# Patient Record
Sex: Female | Born: 1985 | Race: White | Hispanic: No | Marital: Single | Smoking: Current some day smoker
Health system: Southern US, Community
[De-identification: ages and names within clinical notes are randomized; demographics above are authoritative.]

## PROBLEM LIST (undated history)

## (undated) DIAGNOSIS — K219 Gastro-esophageal reflux disease without esophagitis: Secondary | ICD-10-CM

## (undated) DIAGNOSIS — R87629 Unspecified abnormal cytological findings in specimens from vagina: Secondary | ICD-10-CM

## (undated) DIAGNOSIS — N83209 Unspecified ovarian cyst, unspecified side: Secondary | ICD-10-CM

## (undated) HISTORY — PX: ADENOIDECTOMY: SUR15

## (undated) HISTORY — PX: KNEE SURGERY: SHX244

## (undated) HISTORY — PX: WISDOM TOOTH EXTRACTION: SHX21

## (undated) HISTORY — PX: TONSILLECTOMY: SUR1361

## (undated) HISTORY — PX: COLPOSCOPY W/ BIOPSY / CURETTAGE: SUR283

---
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
## 2005-05-01 ENCOUNTER — Other Ambulatory Visit: Admission: RE | Admit: 2005-05-01 | Discharge: 2005-05-01 | Payer: Self-pay | Admitting: Obstetrics and Gynecology

## 2005-07-23 ENCOUNTER — Encounter: Admission: RE | Admit: 2005-07-23 | Discharge: 2005-07-23 | Payer: Self-pay | Admitting: Allergy and Immunology

## 2006-06-20 ENCOUNTER — Other Ambulatory Visit: Admission: RE | Admit: 2006-06-20 | Discharge: 2006-06-20 | Payer: Self-pay | Admitting: Obstetrics and Gynecology

## 2007-09-05 ENCOUNTER — Emergency Department (HOSPITAL_COMMUNITY): Admission: EM | Admit: 2007-09-05 | Discharge: 2007-09-05 | Payer: Self-pay | Admitting: Family Medicine

## 2011-07-09 ENCOUNTER — Inpatient Hospital Stay (INDEPENDENT_AMBULATORY_CARE_PROVIDER_SITE_OTHER)
Admission: RE | Admit: 2011-07-09 | Discharge: 2011-07-09 | Disposition: A | Discharge status: Home / Self Care | Payer: 59 | Source: Ambulatory Visit | Attending: Emergency Medicine | Admitting: Emergency Medicine

## 2011-07-09 DIAGNOSIS — H5704 Mydriasis: Secondary | ICD-10-CM

## 2013-03-24 ENCOUNTER — Other Ambulatory Visit: Payer: Self-pay | Admitting: Obstetrics and Gynecology

## 2013-03-25 LAB — PAP IG, CT-NG, RFX HPV ASCU
Chlamydia Probe Amp: NEGATIVE
GC Probe Amp: NEGATIVE

## 2014-05-11 ENCOUNTER — Emergency Department (HOSPITAL_BASED_OUTPATIENT_CLINIC_OR_DEPARTMENT_OTHER)
Admission: EM | Admit: 2014-05-11 | Discharge: 2014-05-11 | Disposition: A | Discharge status: Home / Self Care | Payer: 59 | Attending: Emergency Medicine | Admitting: Emergency Medicine

## 2014-05-11 ENCOUNTER — Encounter (HOSPITAL_BASED_OUTPATIENT_CLINIC_OR_DEPARTMENT_OTHER): Payer: Self-pay | Admitting: Emergency Medicine

## 2014-05-11 ENCOUNTER — Emergency Department (HOSPITAL_BASED_OUTPATIENT_CLINIC_OR_DEPARTMENT_OTHER): Payer: 59

## 2014-05-11 DIAGNOSIS — R638 Other symptoms and signs concerning food and fluid intake: Secondary | ICD-10-CM | POA: Insufficient documentation

## 2014-05-11 DIAGNOSIS — R110 Nausea: Secondary | ICD-10-CM | POA: Insufficient documentation

## 2014-05-11 DIAGNOSIS — Z8719 Personal history of other diseases of the digestive system: Secondary | ICD-10-CM | POA: Insufficient documentation

## 2014-05-11 DIAGNOSIS — F17200 Nicotine dependence, unspecified, uncomplicated: Secondary | ICD-10-CM | POA: Insufficient documentation

## 2014-05-11 DIAGNOSIS — R109 Unspecified abdominal pain: Secondary | ICD-10-CM

## 2014-05-11 DIAGNOSIS — Z3202 Encounter for pregnancy test, result negative: Secondary | ICD-10-CM | POA: Insufficient documentation

## 2014-05-11 DIAGNOSIS — R1011 Right upper quadrant pain: Secondary | ICD-10-CM | POA: Insufficient documentation

## 2014-05-11 LAB — URINALYSIS, ROUTINE W REFLEX MICROSCOPIC
Bilirubin Urine: NEGATIVE
Glucose, UA: NEGATIVE mg/dL
Hgb urine dipstick: NEGATIVE
Ketones, ur: NEGATIVE mg/dL
Leukocytes, UA: NEGATIVE
Nitrite: NEGATIVE
Protein, ur: NEGATIVE mg/dL
Specific Gravity, Urine: 1.006 (ref 1.005–1.030)
Urobilinogen, UA: 0.2 mg/dL (ref 0.0–1.0)
pH: 6.5 (ref 5.0–8.0)

## 2014-05-11 LAB — CBC WITH DIFFERENTIAL/PLATELET
Basophils Absolute: 0 10*3/uL (ref 0.0–0.1)
Basophils Relative: 0 % (ref 0–1)
EOS PCT: 4 % (ref 0–5)
Eosinophils Absolute: 0.3 10*3/uL (ref 0.0–0.7)
HEMATOCRIT: 44.5 % (ref 36.0–46.0)
Hemoglobin: 16.2 g/dL — ABNORMAL HIGH (ref 12.0–15.0)
LYMPHS PCT: 39 % (ref 12–46)
Lymphs Abs: 2.7 10*3/uL (ref 0.7–4.0)
MCH: 32.5 pg (ref 26.0–34.0)
MCHC: 36.4 g/dL — ABNORMAL HIGH (ref 30.0–36.0)
MCV: 89.2 fL (ref 78.0–100.0)
MONO ABS: 0.7 10*3/uL (ref 0.1–1.0)
Monocytes Relative: 10 % (ref 3–12)
Neutro Abs: 3.2 10*3/uL (ref 1.7–7.7)
Neutrophils Relative %: 47 % (ref 43–77)
Platelets: 224 10*3/uL (ref 150–400)
RBC: 4.99 MIL/uL (ref 3.87–5.11)
RDW: 13.7 % (ref 11.5–15.5)
WBC: 6.9 10*3/uL (ref 4.0–10.5)

## 2014-05-11 LAB — COMPREHENSIVE METABOLIC PANEL
ALT: 25 U/L (ref 0–35)
AST: 23 U/L (ref 0–37)
Albumin: 4.3 g/dL (ref 3.5–5.2)
Alkaline Phosphatase: 64 U/L (ref 39–117)
BUN: 13 mg/dL (ref 6–23)
CALCIUM: 9.5 mg/dL (ref 8.4–10.5)
CO2: 20 meq/L (ref 19–32)
CREATININE: 0.7 mg/dL (ref 0.50–1.10)
Chloride: 103 mEq/L (ref 96–112)
GFR calc Af Amer: >90 mL/min (ref >90)
Glucose, Bld: 96 mg/dL (ref 70–99)
Potassium: 4.3 mEq/L (ref 3.7–5.3)
SODIUM: 138 meq/L (ref 137–147)
TOTAL PROTEIN: 7.5 g/dL (ref 6.0–8.3)
Total Bilirubin: 0.7 mg/dL (ref 0.3–1.2)

## 2014-05-11 LAB — LIPASE, BLOOD: Lipase: 17 U/L (ref 11–59)

## 2014-05-11 LAB — PREGNANCY, URINE: Preg Test, Ur: NEGATIVE

## 2014-05-11 MED ORDER — OMEPRAZOLE 20 MG PO CPDR: 20.0000 mg | DELAYED_RELEASE_CAPSULE | Freq: Every day | ORAL | Status: AC

## 2014-05-11 NOTE — Discharge Instructions (Signed)
Abdominal Pain, Women °Abdominal (stomach, pelvic, or belly) pain can be caused by many things. It is important to tell your doctor: °· The location of the pain. °· Does it come and go or is it present all the time? °· Are there things that start the pain (eating certain foods, exercise)? °· Are there other symptoms associated with the pain (fever, nausea, vomiting, diarrhea)? °All of this is helpful to know when trying to find the cause of the pain. °CAUSES  °· Stomach: virus or bacteria infection, or ulcer. °· Intestine: appendicitis (inflamed appendix), regional ileitis (Crohn's disease), ulcerative colitis (inflamed colon), irritable bowel syndrome, diverticulitis (inflamed diverticulum of the colon), or cancer of the stomach or intestine. °· Gallbladder disease or stones in the gallbladder. °· Kidney disease, kidney stones, or infection. °· Pancreas infection or cancer. °· Fibromyalgia (pain disorder). °· Diseases of the female organs: °· Uterus: fibroid (non-cancerous) tumors or infection. °· Fallopian tubes: infection or tubal pregnancy. °· Ovary: cysts or tumors. °· Pelvic adhesions (scar tissue). °· Endometriosis (uterus lining tissue growing in the pelvis and on the pelvic organs). °· Pelvic congestion syndrome (female organs filling up with blood just before the menstrual period). °· Pain with the menstrual period. °· Pain with ovulation (producing an egg). °· Pain with an IUD (intrauterine device, birth control) in the uterus. °· Cancer of the female organs. °· Functional pain (pain not caused by a disease, may improve without treatment). °· Psychological pain. °· Depression. °DIAGNOSIS  °Your doctor will decide the seriousness of your pain by doing an examination. °· Blood tests. °· X-rays. °· Ultrasound. °· CT scan (computed tomography, special type of X-ray). °· MRI (magnetic resonance imaging). °· Cultures, for infection. °· Barium enema (dye inserted in the large intestine, to better view it with  X-rays). °· Colonoscopy (looking in intestine with a lighted tube). °· Laparoscopy (minor surgery, looking in abdomen with a lighted tube). °· Major abdominal exploratory surgery (looking in abdomen with a large incision). °TREATMENT  °The treatment will depend on the cause of the pain.  °· Many cases can be observed and treated at home. °· Over-the-counter medicines recommended by your caregiver. °· Prescription medicine. °· Antibiotics, for infection. °· Birth control pills, for painful periods or for ovulation pain. °· Hormone treatment, for endometriosis. °· Nerve blocking injections. °· Physical therapy. °· Antidepressants. °· Counseling with a psychologist or psychiatrist. °· Minor or major surgery. °HOME CARE INSTRUCTIONS  °· Do not take laxatives, unless directed by your caregiver. °· Take over-the-counter pain medicine only if ordered by your caregiver. Do not take aspirin because it can cause an upset stomach or bleeding. °· Try a clear liquid diet (broth or water) as ordered by your caregiver. Slowly move to a bland diet, as tolerated, if the pain is related to the stomach or intestine. °· Have a thermometer and take your temperature several times a day, and record it. °· Bed rest and sleep, if it helps the pain. °· Avoid sexual intercourse, if it causes pain. °· Avoid stressful situations. °· Keep your follow-up appointments and tests, as your caregiver orders. °· If the pain does not go away with medicine or surgery, you may try: °· Acupuncture. °· Relaxation exercises (yoga, meditation). °· Group therapy. °· Counseling. °SEEK MEDICAL CARE IF:  °· You notice certain foods cause stomach pain. °· Your home care treatment is not helping your pain. °· You need stronger pain medicine. °· You want your IUD removed. °· You feel faint or   lightheaded. °· You develop nausea and vomiting. °· You develop a rash. °· You are having side effects or an allergy to your medicine. °SEEK IMMEDIATE MEDICAL CARE IF:  °· Your  pain does not go away or gets worse. °· You have a fever. °· Your pain is felt only in portions of the abdomen. The right side could possibly be appendicitis. The left lower portion of the abdomen could be colitis or diverticulitis. °· You are passing blood in your stools (bright red or black tarry stools, with or without vomiting). °· You have blood in your urine. °· You develop chills, with or without a fever. °· You pass out. °MAKE SURE YOU:  °· Understand these instructions. °· Will watch your condition. °· Will get help right away if you are not doing well or get worse. °Document Released: 10/13/2007 Document Revised: 03/09/2012 Document Reviewed: 11/02/2009 °Exit[employer]® Patient Information ©2014 [employer]. ° °

## 2014-05-11 NOTE — ED Provider Notes (Signed)
CSN: [id number]     Arrival date & time 05/11/14  1147 History   First MD Initiated Contact with Patient 05/11/14 1205     Chief Complaint  Patient presents with  . Abdominal Pain     (Consider location/radiation/quality/duration/timing/severity/associated sxs/prior Treatment) HPI Comments: Patient with history of Acid Reflux presents today with 8 days of abdominal pain.  The pain is located in the RUQ and is a constant dull ache throughout the day.  It feels like a sharp stabing/ shooting pain if she eats any food.  The pain also radiates to her back occasionally.  Nothing has helped relieve the pain.  Patient denies any burning sensation like her typical reflux symptoms.  She has had persistent nausea but denies vomiting. Denies fevers. She denies constipation and is still able to pass gas.  Denies dysuria, vaginal discharge.  She also states that every woman in her family has needed a cholecystectomy after their first child.    Patient is a 28 y.o. female presenting with abdominal pain. The history is provided by the patient. No language interpreter was used.  Abdominal Pain Associated symptoms: nausea   Associated symptoms: no chest pain, no constipation, no diarrhea, no dysuria, no fever, no hematuria, no shortness of breath, no vaginal discharge and no vomiting       History reviewed. No pertinent past medical history. Past Surgical History  Procedure Laterality Date  . Cesarean section    . Knee surgery    . Tonsillectomy    . Adenoidectomy     No family history on file. History  Substance Use Topics  . Smoking status: Current Some Day Smoker  . Smokeless tobacco: Not on file  . Alcohol Use: Yes   OB History   Grav Para Term Preterm Abortions TAB SAB Ect Mult Living                 Review of Systems  Constitutional: Positive for appetite change. Negative for fever.  Respiratory: Negative for chest tightness and shortness of breath.   Cardiovascular: Negative for  chest pain.  Gastrointestinal: Positive for nausea and abdominal pain. Negative for vomiting, diarrhea, constipation and blood in stool.  Genitourinary: Negative for dysuria, frequency, hematuria, flank pain, vaginal discharge and difficulty urinating.  Neurological: Negative for dizziness, numbness and headaches.      Allergies  Review of patient's allergies indicates no known allergies.  Home Medications   Prior to Admission medications   Not on File   BP 126/78  Pulse 88  Temp(Src) 98.2 F (36.8 C) (Oral)  Resp 16  Ht 5\' 4"  (1.626 m)  Wt 162 lb (73.483 kg)  BMI 27.79 kg/m2  SpO2 100% Physical Exam  Constitutional: She is oriented to person, place, and time. She appears well-developed and well-nourished. No distress.  Eyes: Conjunctivae are normal. Pupils are equal, round, and reactive to light.  Neck: Normal range of motion.  Cardiovascular: Normal rate, regular rhythm and normal heart sounds.   Pulmonary/Chest: Effort normal and breath sounds normal. No respiratory distress. She has no wheezes.  Abdominal: Soft. Bowel sounds are normal. She exhibits no mass. There is tenderness.  RUQ tenderness that extends to epigastrium.   Neurological: She is alert and oriented to person, place, and time.  Skin: She is not diaphoretic.  Psychiatric: She has a normal mood and affect.    ED Course  Procedures (including critical care time) Labs Review Labs Reviewed  URINALYSIS, ROUTINE W REFLEX MICROSCOPIC  PREGNANCY, URINE  CBC WITH DIFFERENTIAL  COMPREHENSIVE METABOLIC PANEL  LIPASE, BLOOD    Imaging Review No results found.   EKG Interpretation None      MDM   Final diagnoses:  None    1. Abdominal pain  She is comfortable appearing. Labs, ultrasound negative. She reports frustration that answers have not been found. Refer to GI for further evaluation.    Arnoldo HookerShari A Marquelle Balow, PA-C 05/15/14 1253

## 2014-05-11 NOTE — ED Notes (Signed)
Pt returned from US

## 2014-05-11 NOTE — ED Notes (Signed)
abd pain x 8 days-worse x 5 days-pain worse with nausea after eating-feels bloated-last normal BM yesterday-denies V/D

## 2014-05-15 ENCOUNTER — Inpatient Hospital Stay (HOSPITAL_COMMUNITY)
Admission: AD | Admit: 2014-05-15 | Discharge: 2014-05-15 | Disposition: A | Discharge status: Home / Self Care | Payer: 59 | Source: Ambulatory Visit | Attending: Obstetrics and Gynecology | Admitting: Obstetrics and Gynecology

## 2014-05-15 ENCOUNTER — Inpatient Hospital Stay (HOSPITAL_COMMUNITY): Payer: 59

## 2014-05-15 ENCOUNTER — Encounter (HOSPITAL_COMMUNITY): Payer: Self-pay | Admitting: *Deleted

## 2014-05-15 DIAGNOSIS — K219 Gastro-esophageal reflux disease without esophagitis: Secondary | ICD-10-CM | POA: Insufficient documentation

## 2014-05-15 DIAGNOSIS — N949 Unspecified condition associated with female genital organs and menstrual cycle: Secondary | ICD-10-CM | POA: Insufficient documentation

## 2014-05-15 DIAGNOSIS — Z30431 Encounter for routine checking of intrauterine contraceptive device: Secondary | ICD-10-CM | POA: Insufficient documentation

## 2014-05-15 DIAGNOSIS — F17200 Nicotine dependence, unspecified, uncomplicated: Secondary | ICD-10-CM | POA: Insufficient documentation

## 2014-05-15 DIAGNOSIS — N760 Acute vaginitis: Secondary | ICD-10-CM | POA: Insufficient documentation

## 2014-05-15 DIAGNOSIS — B9689 Other specified bacterial agents as the cause of diseases classified elsewhere: Secondary | ICD-10-CM | POA: Insufficient documentation

## 2014-05-15 DIAGNOSIS — A499 Bacterial infection, unspecified: Secondary | ICD-10-CM | POA: Insufficient documentation

## 2014-05-15 HISTORY — DX: Unspecified ovarian cyst, unspecified side: N83.209

## 2014-05-15 HISTORY — DX: Gastro-esophageal reflux disease without esophagitis: K21.9

## 2014-05-15 HISTORY — DX: Unspecified abnormal cytological findings in specimens from vagina: R87.629

## 2014-05-15 LAB — WET PREP, GENITAL
Trich, Wet Prep: NONE SEEN
Yeast Wet Prep HPF POC: NONE SEEN

## 2014-05-15 LAB — URINE MICROSCOPIC-ADD ON: WBC, UA: NONE SEEN WBC/hpf (ref <3)

## 2014-05-15 LAB — URINALYSIS, ROUTINE W REFLEX MICROSCOPIC
Bilirubin Urine: NEGATIVE
GLUCOSE, UA: NEGATIVE mg/dL
Ketones, ur: NEGATIVE mg/dL
LEUKOCYTES UA: NEGATIVE
Nitrite: NEGATIVE
PROTEIN: NEGATIVE mg/dL
UROBILINOGEN UA: 0.2 mg/dL (ref 0.0–1.0)
pH: 6 (ref 5.0–8.0)

## 2014-05-15 LAB — POCT PREGNANCY, URINE: PREG TEST UR: NEGATIVE

## 2014-05-15 MED ORDER — METRONIDAZOLE 500 MG PO TABS: 500.0000 mg | ORAL_TABLET | Freq: Two times a day (BID) | ORAL | Status: AC

## 2014-05-15 NOTE — [facility]U Provider Note (Signed)
History   28yo, G1P1 non-pregnant female presents to MAU today for lower pelvic pain, bilateral - dull with "flares" described as cramping.  Pelvic pain for 2 weeks, seen at ED 5 days ago and worked up for gallbladder/appendix - neg, referred to GI consult (pt does not plan to follow up with this).  Pt also note her breast have increased in size and are "sore, nipple sensitivity" for the last couple of days.  Pt states she feels that her symptoms are identical to symptoms she had previously in early pregnancy.    Labs 5/13 UPT neg UA neg Lipase 17 CMP neg CBC with diff - Hgb 16.2, HCT 44.5  US 5/13 Normal abdominal ultrasound examination. Specifically there are no findings to suggest acute gallbladder pathology.   There are no active problems to display for this patient.   No chief complaint on file.  HPI  OB History   Grav Para Term Preterm Abortions TAB SAB Ect Mult Living   1 1 1       1       Past Medical History  Diagnosis Date  . GERD (gastroesophageal reflux disease)   . Ovarian cyst   . Vaginal Pap smear, abnormal     Past Surgical History  Procedure Laterality Date  . Cesarean section    . Knee surgery    . Tonsillectomy    . Adenoidectomy    . Wisdom tooth extraction    . Colposcopy w/ biopsy / curettage      History reviewed. No pertinent family history.  History  Substance Use Topics  . Smoking status: Current Some Day Smoker -- 13.00 packs/day for 6 years    Types: Cigarettes  . Smokeless tobacco: Not on file  . Alcohol Use: Yes     Comment: once weekly    Allergies: No Known Allergies  Prescriptions prior to admission  Medication Sig Dispense Refill  . Multiple Vitamin (MULTIVITAMIN WITH MINERALS) TABS tablet Take 1 tablet by mouth daily.      Marland Kitchen. omeprazole (PRILOSEC) 20 MG capsule Take 1 capsule (20 mg total) by mouth daily.  20 capsule  0  . SIMETHICONE PO Take 1 tablet by mouth daily as needed (gas).        ROS ROS: see HPI above, all  other systems are negative  Physical Exam   Blood pressure 129/84, pulse 80, temperature 98.2 F (36.8 C), temperature source Oral, resp. rate 16, height 5\' 4"  (1.626 m), weight 173 lb (78.472 kg), last menstrual period 04/10/2014.  Physical Exam Chest: Clear Heart: RRR Abdomen: gravid, NT Extremities: WNL  Pelvic exam:  VULVA: normal appearing vulva with no masses, tenderness or lesions VAGINA: normal appearing vagina with normal color and discharge, no lesions CERVIX: normal appearing cervix without discharge or lesions, cervical motion tenderness absent, Mirena strings present UTERUS: uterus is normal size, shape, consistency and nontender, tenderness noted suprapubically ADNEXA: tenderness center and right, lower, exam chaperoned by Belenda CruiseKristin.  Ultrasound: Uterus - Measurements: 9.5 x 4.6 x 4.7 cm. No fibroids or other mass visualized.  Endometrium - Intrauterine device well seated central in the endometrial cavity.  Right ovary - Measurements: 3.2 x 1 x 1.3 cm. Normal appearance/no adnexal mass. Normal color flow.  Left ovary - Measurements: 3.1 x 1.7 x 1.3 cm. Normal appearance/no adnexal mass. Normal color flow.  Other findings: No free fluid   IMPRESSION:  Well seated intrauterine device, with otherwise unremarkable pelvic ultrasound.   ED Course  28yo G1P1 female  with lower pelvic pain Mirena in place  Wet Prep - few clue cells GC/CT - pending Pelvic us - unremarkable  D/c home with precautions Metronidazole for BV F/u this week with Dr. Estanislado Pandyivard - message sent to office to schedule     Sherry LawsJennifer Elandra Alvarez CNM, MSN 05/15/2014 9:31 PM

## 2014-05-15 NOTE — Discharge Instructions (Signed)
Bacterial Vaginosis °Bacterial vaginosis is a vaginal infection that occurs when the normal balance of bacteria in the vagina is disrupted. It results from an overgrowth of certain bacteria. This is the most common vaginal infection in women of childbearing age. Treatment is important to prevent complications, especially in pregnant women, as it can cause a premature delivery. °CAUSES  °Bacterial vaginosis is caused by an increase in harmful bacteria that are normally present in smaller amounts in the vagina. Several different kinds of bacteria can cause bacterial vaginosis. However, the reason that the condition develops is not fully understood. °RISK FACTORS °Certain activities or behaviors can put you at an increased risk of developing bacterial vaginosis, including: °· Having a new sex partner or multiple sex partners. °· Douching. °· Using an intrauterine device (IUD) for contraception. °Women do not get bacterial vaginosis from toilet seats, bedding, swimming pools, or contact with objects around them. °SIGNS AND SYMPTOMS  °Some women with bacterial vaginosis have no signs or symptoms. Common symptoms include: °· Grey vaginal discharge. °· A fishlike odor with discharge, especially after sexual intercourse. °· Itching or burning of the vagina and vulva. °· Burning or pain with urination. °DIAGNOSIS  °Your health care provider will take a medical history and examine the vagina for signs of bacterial vaginosis. A sample of vaginal fluid may be taken. Your health care provider will look at this sample under a microscope to check for bacteria and abnormal cells. A vaginal pH test may also be done.  °TREATMENT  °Bacterial vaginosis may be treated with antibiotic medicines. These may be given in the form of a pill or a vaginal cream. A second round of antibiotics may be prescribed if the condition comes back after treatment.  °HOME CARE INSTRUCTIONS  °· Only take over-the-counter or prescription medicines as  directed by your health care provider. °· If antibiotic medicine was prescribed, take it as directed. Make sure you finish it even if you start to feel better. °· Do not have sex until treatment is completed. °· Tell all sexual partners that you have a vaginal infection. They should see their health care provider and be treated if they have problems, such as a mild rash or itching. °· Practice safe sex by using condoms and only having one sex partner. °SEEK MEDICAL CARE IF:  °· Your symptoms are not improving after 3 days of treatment. °· You have increased discharge or pain. °· You have a fever. °MAKE SURE YOU:  °· Understand these instructions. °· Will watch your condition. °· Will get help right away if you are not doing well or get worse. °FOR MORE INFORMATION  °Centers for Disease Control and Prevention, Division of STD Prevention: www.cdc.gov/std °American Sexual Health Association (ASHA): www.ashastd.org  °Document Released: 12/16/2005 Document Revised: 10/06/2013 Document Reviewed: 07/28/2013 °[employer]® Patient Information ©2014 [employer]. °Metronidazole tablets or capsules °What is this medicine? °METRONIDAZOLE (me troe NI da zole) is an antiinfective. It is used to treat certain kinds of bacterial and protozoal infections. It will not work for colds, flu, or other viral infections. °This medicine may be used for other purposes; ask your health care provider or pharmacist if you have questions. °COMMON BRAND NAME(S): Flagyl °What should I tell my health care provider before I take this medicine? °They need to know if you have any of these conditions: °-anemia or other blood disorders °-disease of the nervous system °-fungal or yeast infection °-if you drink alcohol containing drinks °-liver disease °-seizures °-an unusual or allergic   reaction to metronidazole, or other medicines, foods, dyes, or preservatives °-pregnant or trying to get pregnant °-breast-feeding °How should I use this medicine? °Take  this medicine by mouth with a full glass of water. Follow the directions on the prescription label. Take your medicine at regular intervals. Do not take your medicine more often than directed. Take all of your medicine as directed even if you think you are better. Do not skip doses or stop your medicine early. °Talk to your pediatrician regarding the use of this medicine in children. Special care may be needed. °Overdosage: If you think you have taken too much of this medicine contact a poison control center or emergency room at once. °NOTE: This medicine is only for you. Do not share this medicine with others. °What if I miss a dose? °If you miss a dose, take it as soon as you can. If it is almost time for your next dose, take only that dose. Do not take double or extra doses. °What may interact with this medicine? °Do not take this medicine with any of the following medications: °-alcohol or any product that contains alcohol °-amprenavir oral solution °-cisapride °-disulfiram °-dofetilide °-dronedarone °-paclitaxel injection °-pimozide °-ritonavir oral solution °-sertraline oral solution °-sulfamethoxazole-trimethoprim injection °-thioridazine °-ziprasidone °This medicine may also interact with the following medications: °-cimetidine °-lithium °-other medicines that prolong the QT interval (cause an abnormal heart rhythm) °-phenobarbital °-phenytoin °-warfarin °This list may not describe all possible interactions. Give your health care provider a list of all the medicines, herbs, non-prescription drugs, or dietary supplements you use. Also tell them if you smoke, drink alcohol, or use illegal drugs. Some items may interact with your medicine. °What should I watch for while using this medicine? °Tell your doctor or health care professional if your symptoms do not improve or if they get worse. °You may get drowsy or dizzy. Do not drive, use machinery, or do anything that needs mental alertness until you know how  this medicine affects you. Do not stand or sit up quickly, especially if you are an older patient. This reduces the risk of dizzy or fainting spells. °Avoid alcoholic drinks while you are taking this medicine and for three days afterward. Alcohol may make you feel dizzy, sick, or flushed. °If you are being treated for a sexually transmitted disease, avoid sexual contact until you have finished your treatment. Your sexual partner may also need treatment. °What side effects may I notice from receiving this medicine? °Side effects that you should report to your doctor or health care professional as soon as possible: °-allergic reactions like skin rash or hives, swelling of the face, lips, or tongue °-confusion, clumsiness °-difficulty speaking °-discolored or sore mouth °-dizziness °-fever, infection °-numbness, tingling, pain or weakness in the hands or feet °-trouble passing urine or change in the amount of urine °-redness, blistering, peeling or loosening of the skin, including inside the mouth °-seizures °-unusually weak or tired °-vaginal irritation, dryness, or discharge °Side effects that usually do not require medical attention (report to your doctor or health care professional if they continue or are bothersome): °-diarrhea °-headache °-irritability °-metallic taste °-nausea °-stomach pain or cramps °-trouble sleeping °This list may not describe all possible side effects. Call your doctor for medical advice about side effects. You may report side effects to FDA at 1-800-FDA-1088. °Where should I keep my medicine? °Keep out of the reach of children. °Store at room temperature below 25 degrees C (77 degrees F). Protect from light. Keep container tightly closed.   Throw away any unused medicine after the expiration date. °NOTE: This sheet is a summary. It may not cover all possible information. If you have questions about this medicine, talk to your doctor, pharmacist, or health care provider. °© 2014,  [employer]. (2013-06-15 14:08:26) ° °

## 2014-05-16 LAB — GC/CHLAMYDIA PROBE AMP
CT Probe RNA: POSITIVE — AB
GC PROBE AMP APTIMA: NEGATIVE

## 2014-05-16 NOTE — ED Provider Notes (Signed)
Medical screening examination/treatment/procedure(s) were performed by non-physician practitioner and as supervising physician I was immediately available for consultation/collaboration.     Geoffery Lyonsouglas Jerilynn Feldmeier, MD 05/16/14 [phone]

## 2014-06-29 ENCOUNTER — Emergency Department (HOSPITAL_BASED_OUTPATIENT_CLINIC_OR_DEPARTMENT_OTHER): Payer: 59

## 2014-06-29 ENCOUNTER — Encounter (HOSPITAL_BASED_OUTPATIENT_CLINIC_OR_DEPARTMENT_OTHER): Payer: Self-pay | Admitting: Emergency Medicine

## 2014-06-29 ENCOUNTER — Emergency Department (HOSPITAL_BASED_OUTPATIENT_CLINIC_OR_DEPARTMENT_OTHER)
Admission: EM | Admit: 2014-06-29 | Discharge: 2014-06-29 | Disposition: A | Discharge status: Home / Self Care | Payer: 59 | Attending: Emergency Medicine | Admitting: Emergency Medicine

## 2014-06-29 DIAGNOSIS — S8990XA Unspecified injury of unspecified lower leg, initial encounter: Secondary | ICD-10-CM | POA: Insufficient documentation

## 2014-06-29 DIAGNOSIS — F17200 Nicotine dependence, unspecified, uncomplicated: Secondary | ICD-10-CM | POA: Insufficient documentation

## 2014-06-29 DIAGNOSIS — Z9889 Other specified postprocedural states: Secondary | ICD-10-CM | POA: Insufficient documentation

## 2014-06-29 DIAGNOSIS — K219 Gastro-esophageal reflux disease without esophagitis: Secondary | ICD-10-CM | POA: Insufficient documentation

## 2014-06-29 DIAGNOSIS — Y9389 Activity, other specified: Secondary | ICD-10-CM | POA: Insufficient documentation

## 2014-06-29 DIAGNOSIS — Z8742 Personal history of other diseases of the female genital tract: Secondary | ICD-10-CM | POA: Insufficient documentation

## 2014-06-29 DIAGNOSIS — S99919A Unspecified injury of unspecified ankle, initial encounter: Principal | ICD-10-CM

## 2014-06-29 DIAGNOSIS — S99929A Unspecified injury of unspecified foot, initial encounter: Principal | ICD-10-CM

## 2014-06-29 DIAGNOSIS — R296 Repeated falls: Secondary | ICD-10-CM | POA: Insufficient documentation

## 2014-06-29 DIAGNOSIS — M79672 Pain in left foot: Secondary | ICD-10-CM

## 2014-06-29 DIAGNOSIS — Y9289 Other specified places as the place of occurrence of the external cause: Secondary | ICD-10-CM | POA: Insufficient documentation

## 2014-06-29 DIAGNOSIS — Z79899 Other long term (current) drug therapy: Secondary | ICD-10-CM | POA: Insufficient documentation

## 2014-06-29 NOTE — ED Notes (Signed)
Left foot pain s/p injury in June, pt able to ambulate

## 2014-06-29 NOTE — ED Provider Notes (Signed)
CSN: [id number]     Arrival date & time 06/29/14  2001 History   First MD Initiated Contact with Patient 06/29/14 2044     Chief Complaint  Patient presents with  . Foot Injury     (Consider location/radiation/quality/duration/timing/severity/associated sxs/prior Treatment) Patient is a 28 y.o. female presenting with foot injury. The history is provided by the patient. No language interpreter was used.  Foot Injury Location:  Foot and ankle Time since incident:  5 weeks Ankle location:  L ankle Foot location:  L foot Pain details:    Quality:  Aching   Radiates to:  Does not radiate   Severity:  Mild Chronicity:  New Dislocation: no   Foreign body present:  No foreign bodies Relieved by:  Nothing Worsened by:  Nothing tried Risk factors: no concern for non-accidental trauma   Pt turned foot about 5 weeks ago.   Pt complains of swelling and pain to outside of foot (5th) and ankle.  Pt has decreased range of motion of toes  Past Medical History  Diagnosis Date  . GERD (gastroesophageal reflux disease)   . Ovarian cyst   . Vaginal Pap smear, abnormal    Past Surgical History  Procedure Laterality Date  . Cesarean section    . Knee surgery    . Tonsillectomy    . Adenoidectomy    . Wisdom tooth extraction    . Colposcopy w/ biopsy / curettage    . Knee surgery     History reviewed. No pertinent family history. History  Substance Use Topics  . Smoking status: Current Some Day Smoker -- 13.00 packs/day for 6 years    Types: Cigarettes  . Smokeless tobacco: Not on file  . Alcohol Use: Yes     Comment: once weekly   OB History   Grav Para Term Preterm Abortions TAB SAB Ect Mult Living   1 1 1       1      Review of Systems  Musculoskeletal: Positive for joint swelling and myalgias.  All other systems reviewed and are negative.     Allergies  Review of patient's allergies indicates no known allergies.  Home Medications   Prior to Admission medications    Medication Sig Start Date End Date Taking? Authorizing Provider  loratadine (CLARITIN) 10 MG tablet Take 10 mg by mouth daily.   Yes Historical Provider, MD  Multiple Vitamin (MULTIVITAMIN WITH MINERALS) TABS tablet Take 1 tablet by mouth daily.    Historical Provider, MD  omeprazole (PRILOSEC) 20 MG capsule Take 1 capsule (20 mg total) by mouth daily. 05/11/14   Shari A Upstill, PA-C  SIMETHICONE PO Take 1 tablet by mouth daily as needed (gas).    Historical Provider, MD   BP 121/91  Pulse 82  Temp(Src) 98.4 F (36.9 C) (Oral)  Resp 20  Ht 5\' 4"  (1.626 m)  Wt 158 lb (71.668 kg)  BMI 27.11 kg/m2  SpO2 98% Physical Exam  Nursing note and vitals reviewed. Constitutional: She is oriented to person, place, and time. She appears well-developed and well-nourished.  HENT:  Head: Normocephalic.  Eyes: EOM are normal.  Neck: Normal range of motion.  Pulmonary/Chest: Effort normal.  Musculoskeletal: She exhibits tenderness.  Tender left foot at base of 5th metatarsal,  Tender distal fibula area,  Decreased range of motion toes compaired with right side,  nv and ns intact  Neurological: She is alert and oriented to person, place, and time.  Skin: Skin is warm.  Psychiatric: She has a normal mood and affect.    ED Course  Procedures (including critical care time) Labs Review Labs Reviewed - No data to display  Imaging Review Dg Ankle Complete Left  06/29/2014   CLINICAL DATA:  Left ankle pain post fall  EXAM: LEFT ANKLE COMPLETE - 3+ VIEW  COMPARISON:  None.  FINDINGS: Three views of left ankle submitted. No acute fracture or subluxation. No radiopaque foreign body. Ankle mortise is preserved.  IMPRESSION: Negative.   Electronically Signed   By: Natasha MeadLiviu  Pop M.D.   On: 06/29/2014 20:52   Dg Foot Complete Left  06/29/2014   CLINICAL DATA:  Left foot pain, fall  EXAM: LEFT FOOT - COMPLETE 3+ VIEW  COMPARISON:  None.  FINDINGS: Three views of the left foot submitted. No acute fracture or  subluxation. No radiopaque foreign body.  IMPRESSION: Negative.   Electronically Signed   By: Natasha MeadLiviu  Pop M.D.   On: 06/29/2014 20:53     EKG Interpretation None      MDM  Pt counseled,  I advised follow up with Dr. Jerl Santosdalldorf for recheck.  Pt placed in a cam walker Ibuprofen,    Final diagnoses:  Foot pain, left    xrays no fractures.     Lonia SkinnerLeslie K BroadwaterSofia, PA-C 06/29/14 2122

## 2014-06-29 NOTE — ED Notes (Signed)
Pt requesting to speak to PA again. Sofia made aware.

## 2014-06-29 NOTE — Discharge Instructions (Signed)
Ankle Sprain °An ankle sprain is an injury to the strong, fibrous tissues (ligaments) that hold the bones of your ankle joint together.  °CAUSES °An ankle sprain is usually caused by a fall or by twisting your ankle. Ankle sprains most commonly occur when you step on the outer edge of your foot, and your ankle turns inward. People who participate in sports are more prone to these types of injuries.  °SYMPTOMS  °· Pain in your ankle. The pain may be present at rest or only when you are trying to stand or walk. °· Swelling. °· Bruising. Bruising may develop immediately or within 1 to 2 days after your injury. °· Difficulty standing or walking, particularly when turning corners or changing directions. °DIAGNOSIS  °Your caregiver will ask you details about your injury and perform a physical exam of your ankle to determine if you have an ankle sprain. During the physical exam, your caregiver will press on and apply pressure to specific areas of your foot and ankle. Your caregiver will try to move your ankle in certain ways. An X-ray exam may be done to be sure a bone was not broken or a ligament did not separate from one of the bones in your ankle (avulsion fracture).  °TREATMENT  °Certain types of braces can help stabilize your ankle. Your caregiver can make a recommendation for this. Your caregiver may recommend the use of medicine for pain. If your sprain is severe, your caregiver may refer you to a surgeon who helps to restore function to parts of your skeletal system (orthopedist) or a physical therapist. °HOME CARE INSTRUCTIONS  °· Apply ice to your injury for 1-2 days or as directed by your caregiver. Applying ice helps to reduce inflammation and pain. °· Put ice in a plastic bag. °· Place a towel between your skin and the bag. °· Leave the ice on for 15-20 minutes at a time, every 2 hours while you are awake. °· Only take over-the-counter or prescription medicines for pain, discomfort, or fever as directed by  your caregiver. °· Elevate your injured ankle above the level of your heart as much as possible for 2-3 days. °· If your caregiver recommends crutches, use them as instructed. Gradually put weight on the affected ankle. Continue to use crutches or a cane until you can walk without feeling pain in your ankle. °· If you have a plaster splint, wear the splint as directed by your caregiver. Do not rest it on anything harder than a pillow for the first 24 hours. Do not put weight on it. Do not get it wet. You may take it off to take a shower or bath. °· You may have been given an elastic bandage to wear around your ankle to provide support. If the elastic bandage is too tight (you have numbness or tingling in your foot or your foot becomes cold and blue), adjust the bandage to make it comfortable. °· If you have an air splint, you may blow more air into it or let air out to make it more comfortable. You may take your splint off at night and before taking a shower or bath. Wiggle your toes in the splint several times per day to decrease swelling. °SEEK MEDICAL CARE IF:  °· You have rapidly increasing bruising or swelling. °· Your toes feel extremely cold or you lose feeling in your foot. °· Your pain is not relieved with medicine. °SEEK IMMEDIATE MEDICAL CARE IF: °· Your toes are numb or blue. °·   You have severe pain that is increasing. °MAKE SURE YOU:  °· Understand these instructions. °· Will watch your condition. °· Will get help right away if you are not doing well or get worse. °Document Released: 12/16/2005 Document Revised: 09/09/2012 Document Reviewed: 12/28/2011 °[employer]® Patient Information ©2015 [employer]. This information is not intended to replace advice given to you by your health care provider. Make sure you discuss any questions you have with your health care provider. ° °Foot Sprain °The muscles and cord like structures which attach muscle to bone (tendons) that surround the feet are made up of  units. A foot sprain can occur at the weakest spot in any of these units. This condition is most often caused by injury to or overuse of the foot, as from playing contact sports, or aggravating a previous injury, or from poor conditioning, or obesity. °SYMPTOMS °· Pain with movement of the foot. °· Tenderness and swelling at the injury site. °· Loss of strength is present in moderate or severe sprains. °THE THREE GRADES OR SEVERITY OF FOOT SPRAIN ARE: °· Mild (Grade I): Slightly pulled muscle without tearing of muscle or tendon fibers or loss of strength. °· Moderate (Grade II): Tearing of fibers in a muscle, tendon, or at the attachment to bone, with small decrease in strength. °· Severe (Grade III): Rupture of the muscle-tendon-bone attachment, with separation of fibers. Severe sprain requires surgical repair. Often repeating (chronic) sprains are caused by overuse. Sudden (acute) sprains are caused by direct injury or over-use. °DIAGNOSIS  °Diagnosis of this condition is usually by your own observation. If problems continue, a caregiver may be required for further evaluation and treatment. X-rays may be required to make sure there are not breaks in the bones (fractures) present. Continued problems may require physical therapy for treatment. °PREVENTION °· Use strength and conditioning exercises appropriate for your sport. °· Warm up properly prior to working out. °· Use athletic shoes that are made for the sport you are participating in. °· Allow adequate time for healing. Early return to activities makes repeat injury more likely, and can lead to an unstable arthritic foot that can result in prolonged disability. Mild sprains generally heal in 3 to 10 days, with moderate and severe sprains taking 2 to 10 weeks. Your caregiver can help you determine the proper time required for healing. °HOME CARE INSTRUCTIONS  °· Apply ice to the injury for 15-20 minutes, 03-04 times per day. Put the ice in a plastic bag and  place a towel between the bag of ice and your skin. °· An elastic wrap (like an Ace bandage) may be used to keep swelling down. °· Keep foot above the level of the heart, or at least raised on a footstool, when swelling and pain are present. °· Try to avoid use other than gentle range of motion while the foot is painful. Do not resume use until instructed by your caregiver. Then begin use gradually, not increasing use to the point of pain. If pain does develop, decrease use and continue the above measures, gradually increasing activities that do not cause discomfort, until you gradually achieve normal use. °· Use crutches if and as instructed, and for the length of time instructed. °· Keep injured foot and ankle wrapped between treatments. °· Massage foot and ankle for comfort and to keep swelling down. Massage from the toes up towards the knee. °· Only take over-the-counter or prescription medicines for pain, discomfort, or fever as directed by your caregiver. °SEEK IMMEDIATE   MEDICAL CARE IF:  °· Your pain and swelling increase, or pain is not controlled with medications. °· You have loss of feeling in your foot or your foot turns cold or blue. °· You develop new, unexplained symptoms, or an increase of the symptoms that brought you to your caregiver. °MAKE SURE YOU:  °· Understand these instructions. °· Will watch your condition. °· Will get help right away if you are not doing well or get worse. °Document Released: 06/07/2002 Document Revised: 03/09/2012 Document Reviewed: 08/04/2008 °[employer]® Patient Information ©2015 [employer]. This information is not intended to replace advice given to you by your health care provider. Make sure you discuss any questions you have with your health care provider. ° °

## 2014-06-29 NOTE — ED Notes (Signed)
PA at bedside to talk with pt again.

## 2014-07-05 NOTE — ED Provider Notes (Signed)
Medical screening examination/treatment/procedure(s) were performed by non-physician practitioner and as supervising physician I was immediately available for consultation/collaboration.   EKG Interpretation None        Jniya Madara, MD 07/05/14 0326 

## 2014-10-31 ENCOUNTER — Encounter (HOSPITAL_BASED_OUTPATIENT_CLINIC_OR_DEPARTMENT_OTHER): Payer: Self-pay | Admitting: Emergency Medicine

## 2016-04-27 IMAGING — US US ABDOMEN COMPLETE
1 series · 14 of 25 positions shown · non-contrast
Comparison: None.

CLINICAL DATA: Epigastric and right upper quadrant pain for over a
week gradually progressing in intensity

EXAM:
ULTRASOUND ABDOMEN COMPLETE

[Series 1: us abdomen complete · 0.32mm/px · 14 of 79 slices shown]
[im 1/79]
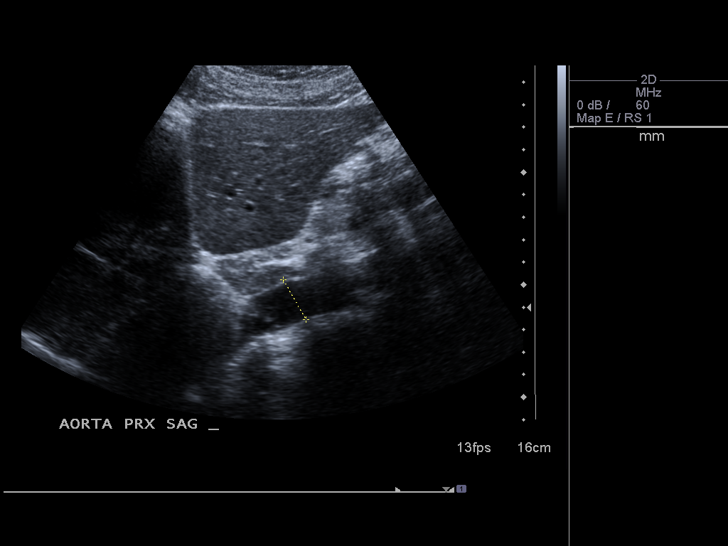
[im 7/79]
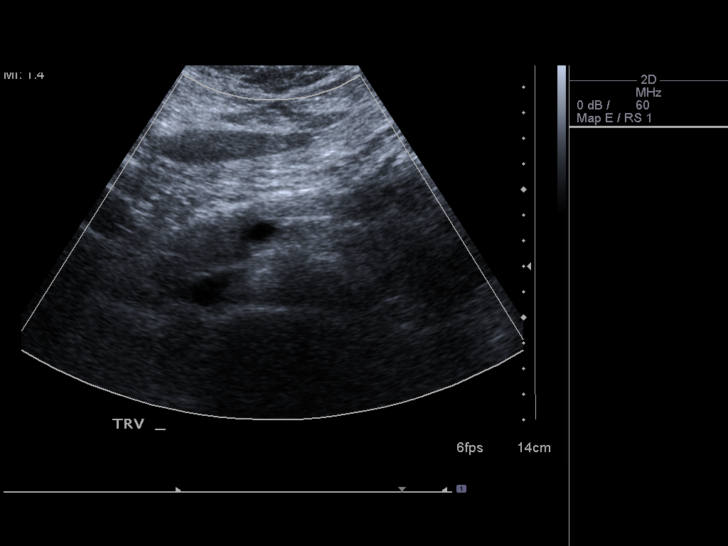
[im 14/79]
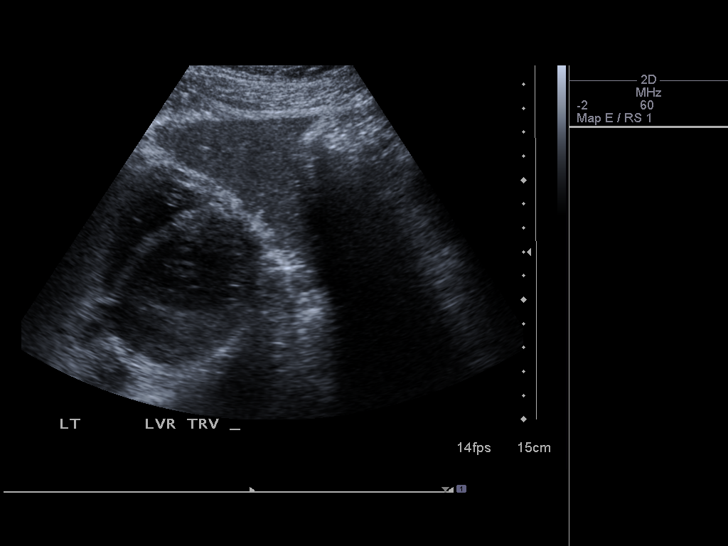
[im 20/79]
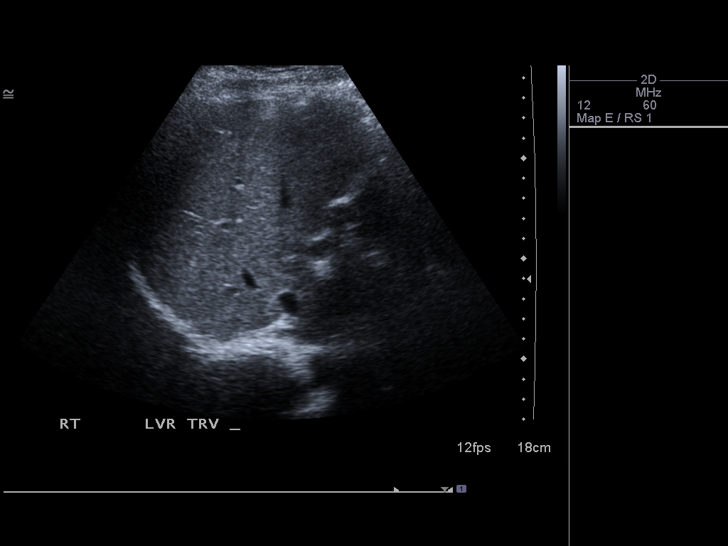
[im 27/79]
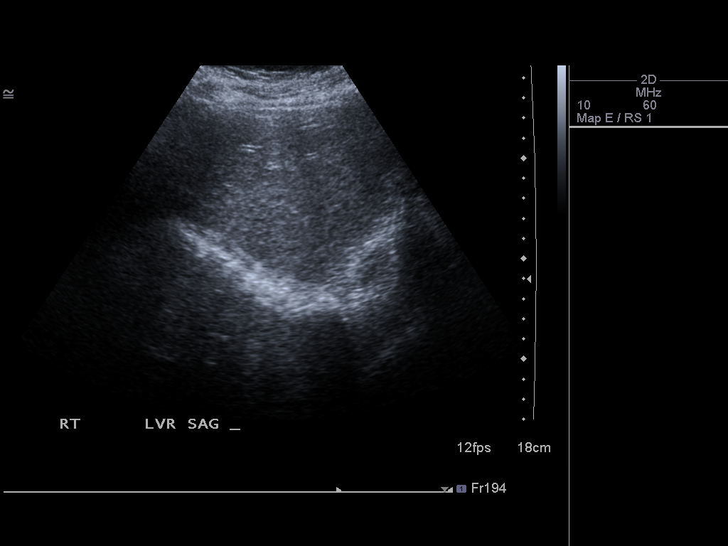
[im 30/79]
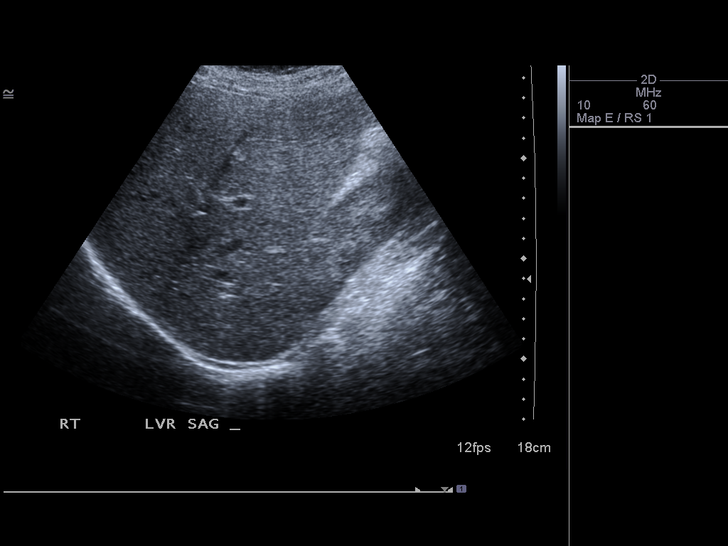
[im 36/79]
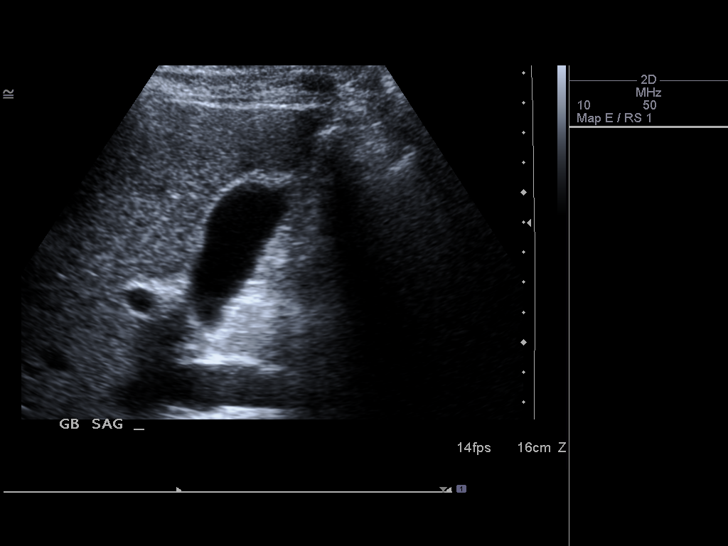
[im 43/79]
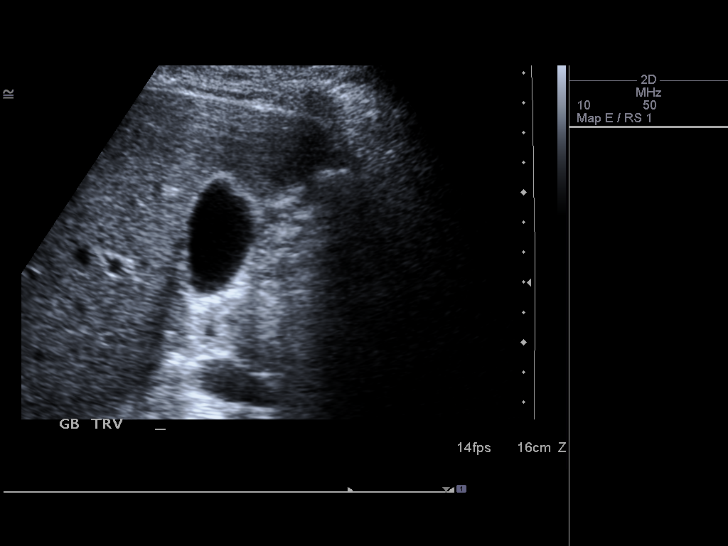
[im 49/79]
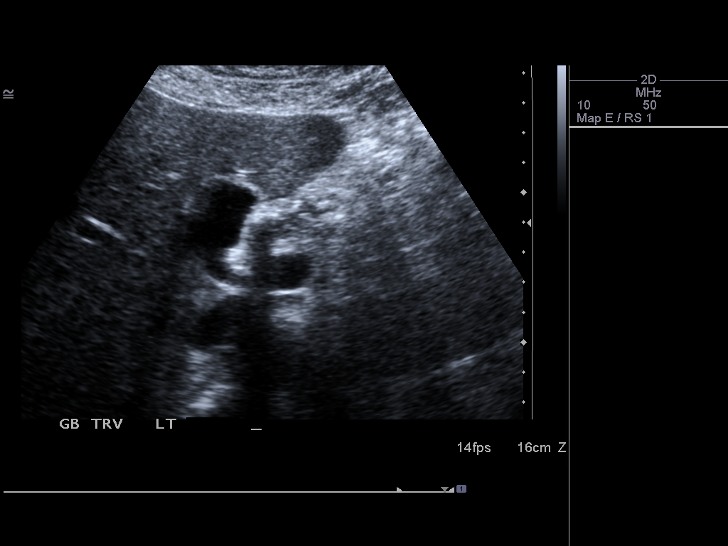
[im 53/79]
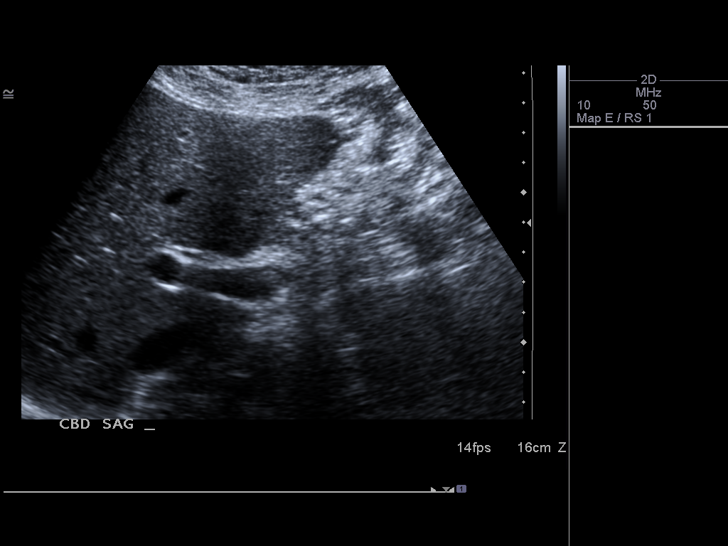
[im 59/79]
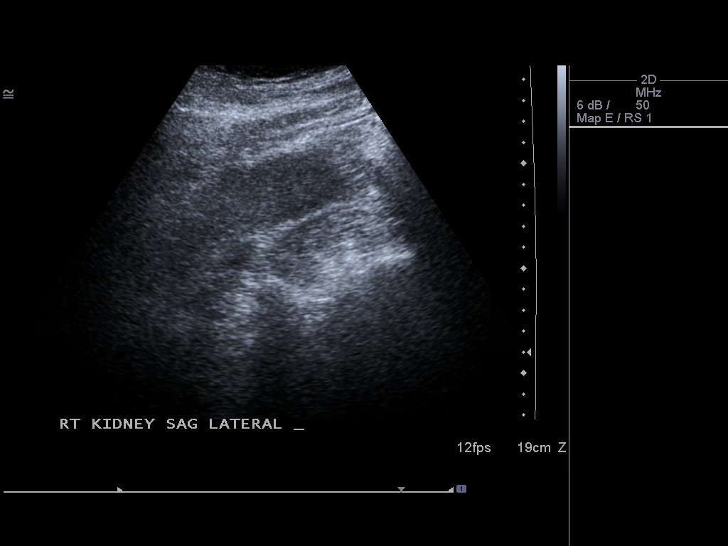
[im 66/79]
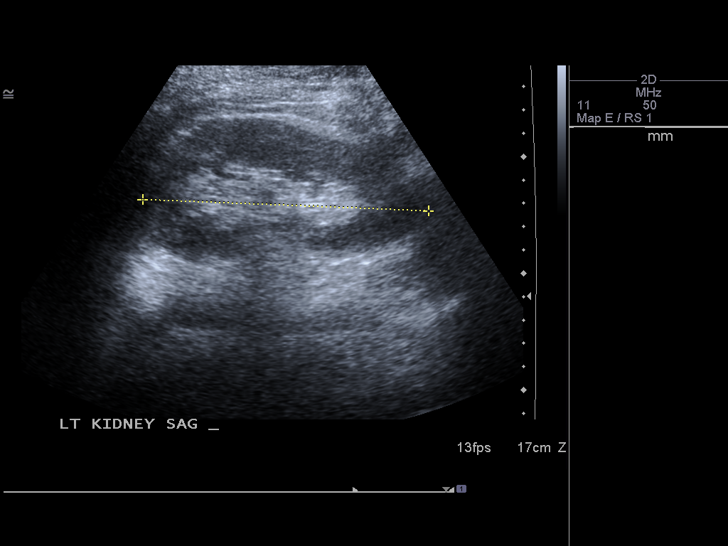
[im 72/79]
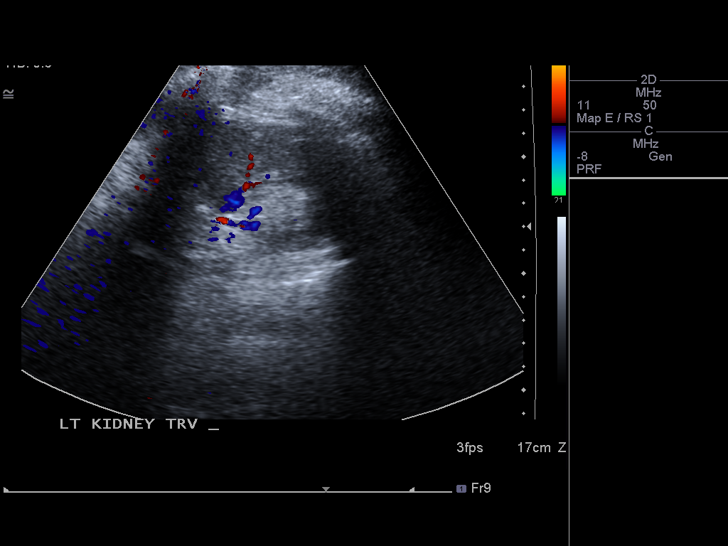
[im 79/79]
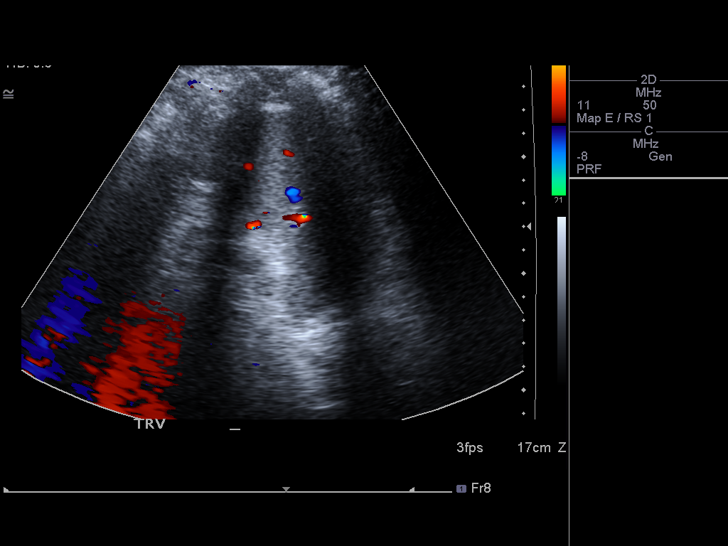

[14 of 25 positions shown; findings below may reference images not displayed]

FINDINGS: Gallbladder:

No gallstones or wall thickening visualized. No sonographic Murphy
sign noted.

Common bile duct:

Diameter: 2.5 mm

Liver:

No focal lesion identified. Within normal limits in parenchymal
echogenicity.

IVC:

No abnormality visualized.

Pancreas:

Visualized portion unremarkable.

Spleen:

Size and appearance within normal limits.

Right Kidney:

Length: 11.8 cm. Echogenicity within normal limits. No mass or
hydronephrosis visualized.

Left Kidney:

Length: 12.3 cm. Echogenicity within normal limits. No mass or
hydronephrosis visualized.

Abdominal aorta:

No aneurysm visualized.

Other findings:

No ascites is demonstrated.
IMPRESSION: Normal abdominal ultrasound examination. Specifically there are no
findings to suggest acute gallbladder pathology.

## 2016-06-15 IMAGING — CR DG ANKLE COMPLETE 3+V*L*
3 series · 3 of 3 positions shown · non-contrast
Comparison: None.

CLINICAL DATA: Left ankle pain post fall

EXAM:
LEFT ANKLE COMPLETE - 3+ VIEW

[t ankle joint ap left]
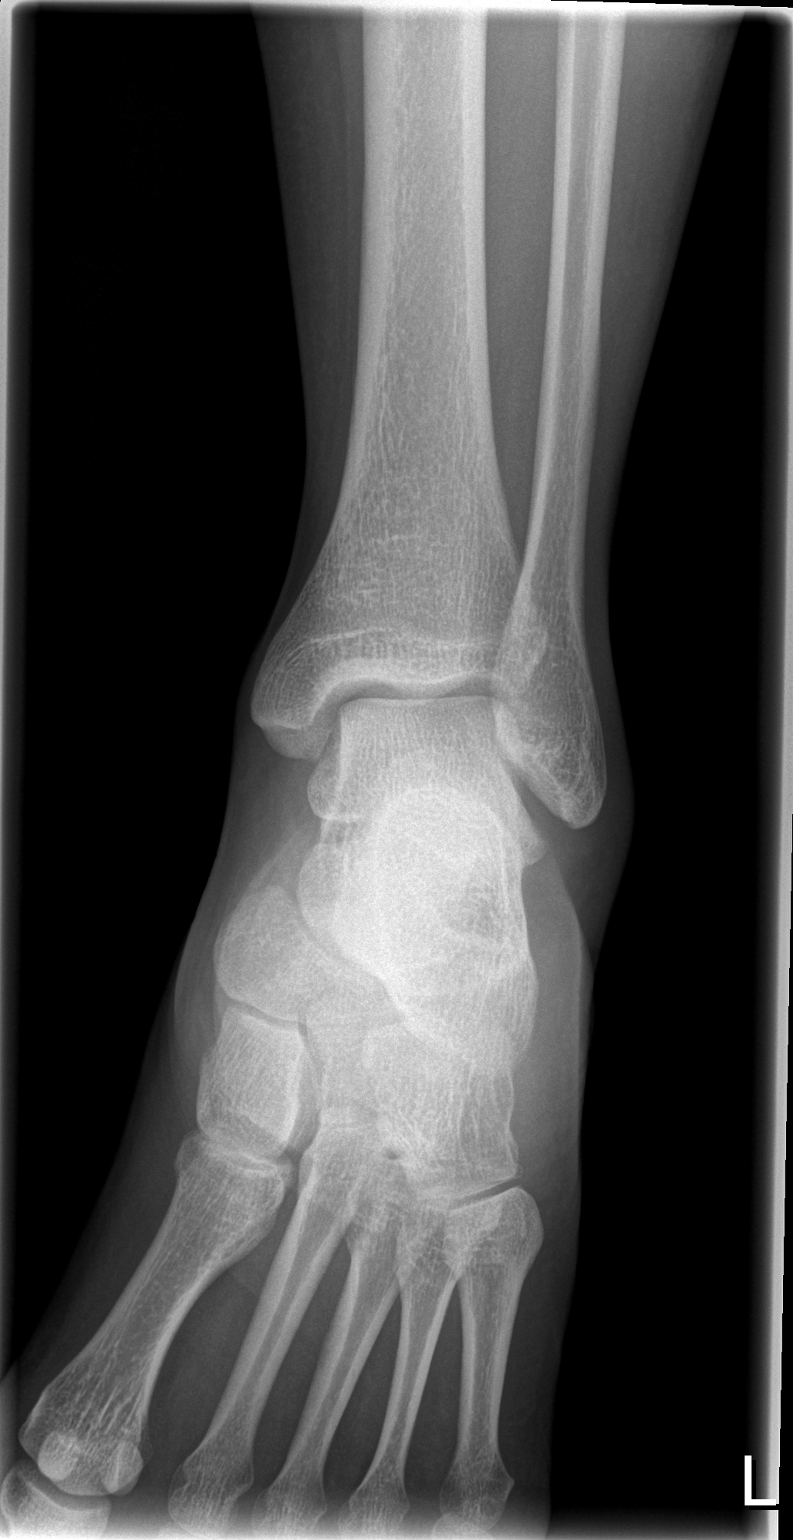

[t ankle joint oblique left]
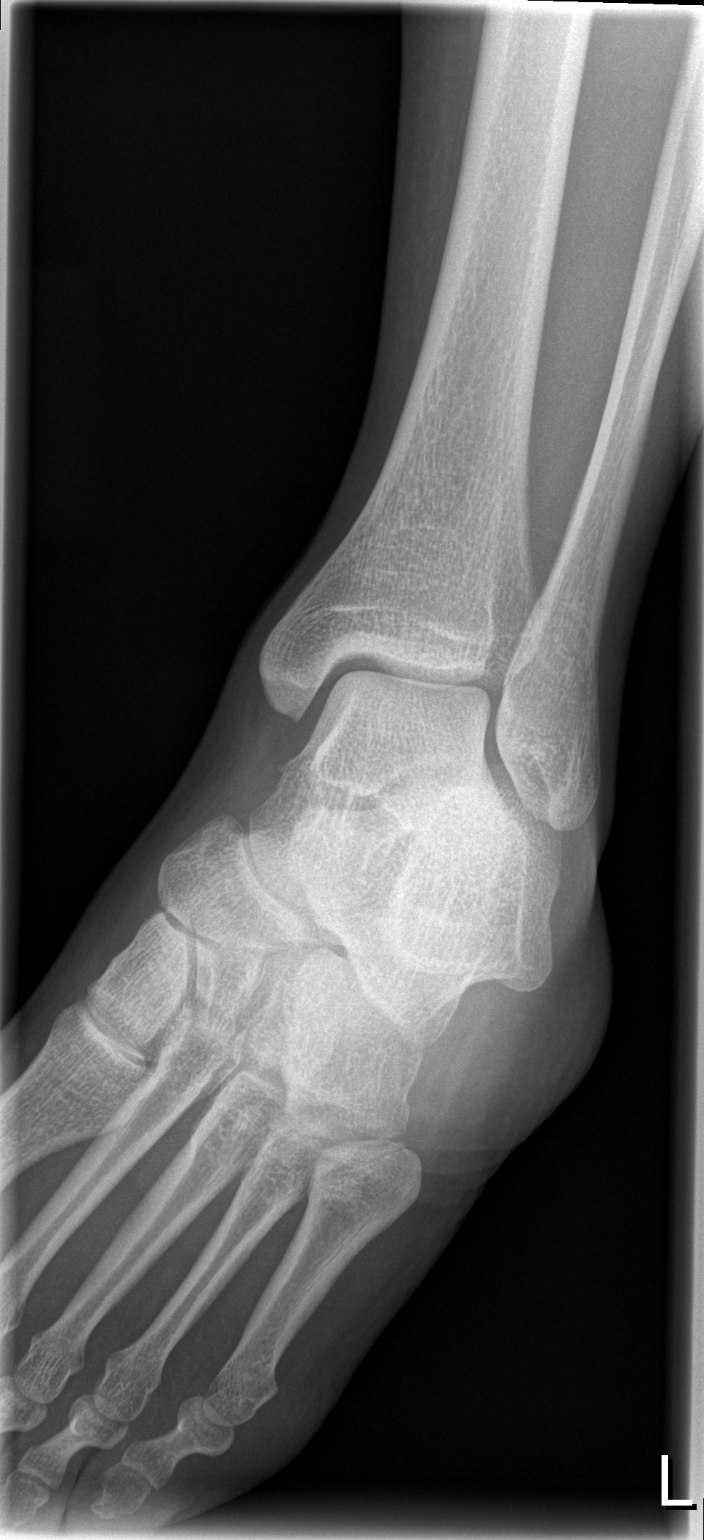

[t ankle joint lat left]
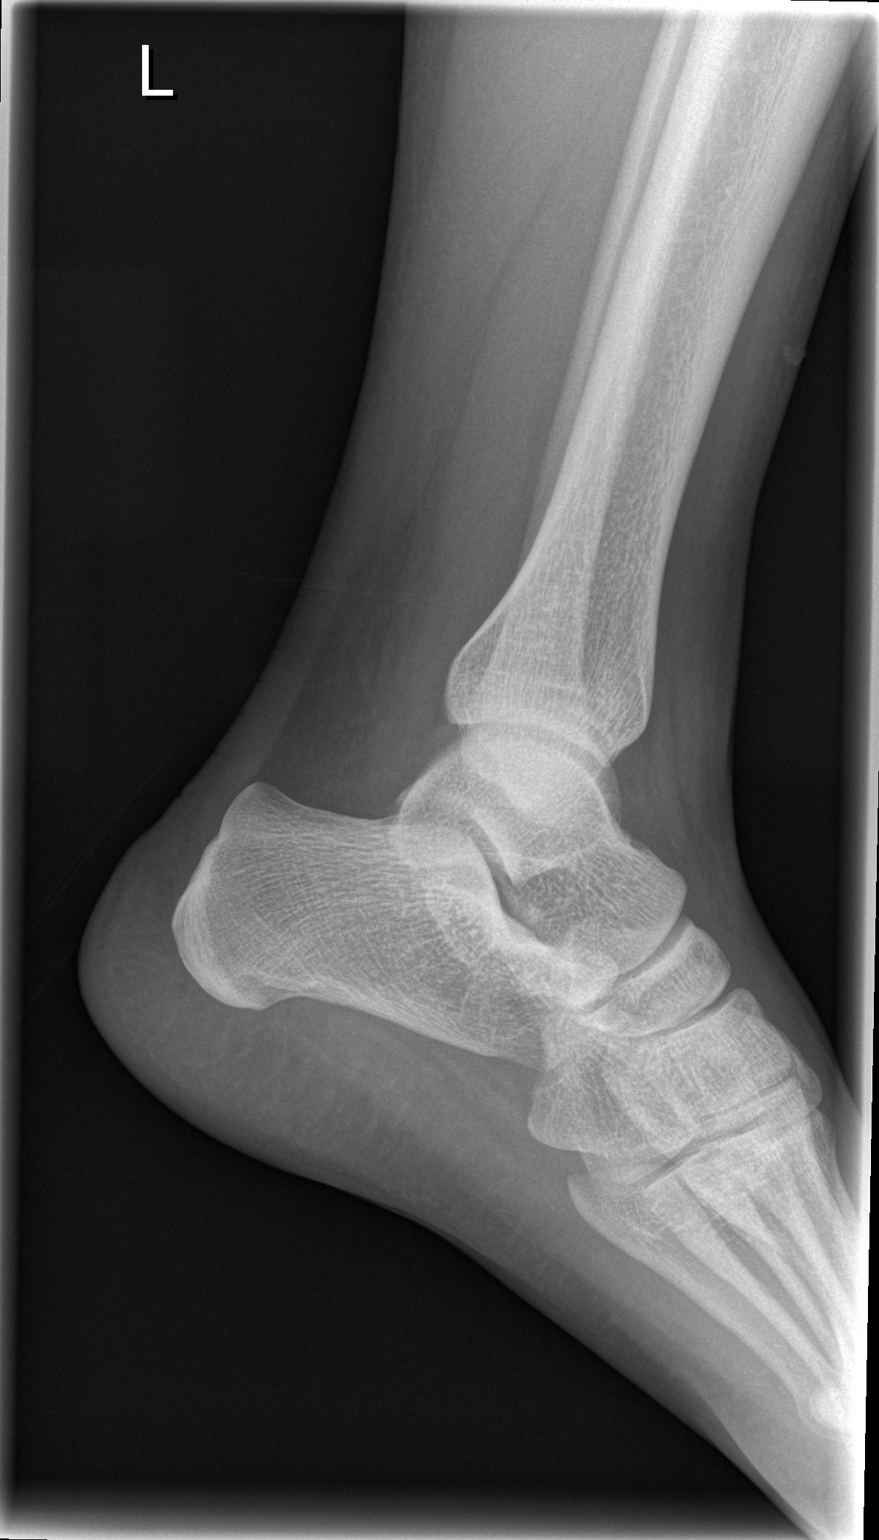

[3 of 3 positions shown; findings below may reference images not displayed]

FINDINGS: Three views of left ankle submitted. No acute fracture or
subluxation. No radiopaque foreign body. Ankle mortise is preserved.
IMPRESSION: Negative.
